# Patient Record
Sex: Male | Born: 1985 | Race: White | Hispanic: No | Marital: Married | Smoking: Never smoker
Health system: Southern US, Community
[De-identification: ages and names within clinical notes are randomized; demographics above are authoritative.]

---
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
## 2014-11-04 HISTORY — PX: MOUTH SURGERY: SHX715

## 2014-12-13 ENCOUNTER — Ambulatory Visit (INDEPENDENT_AMBULATORY_CARE_PROVIDER_SITE_OTHER): Payer: Managed Care, Other (non HMO) | Admitting: Family Medicine

## 2014-12-13 ENCOUNTER — Encounter: Payer: Self-pay | Admitting: Family Medicine

## 2014-12-13 VITALS — BP 140/81 | HR 63 | Ht 75.0 in | Wt 263.0 lb

## 2014-12-13 DIAGNOSIS — Z0000 Encounter for general adult medical examination without abnormal findings: Secondary | ICD-10-CM | POA: Diagnosis not present

## 2014-12-13 NOTE — Progress Notes (Signed)
CC: Arthur Payne is a 29 y.o. male is here for Establish Care and Annual Exam   Subjective: HPI:  No family history of colon cancer or prostate cancer, will consider screening at age 36  Influenza Vaccine: Declined Pneumovax: No current indication Td/Tdap: Up-to-date Zoster: (Start 30 yo)  Presented for complete physical exam without any complaints.  Review of Systems - General ROS: negative for - chills, fever, night sweats, weight gain or weight loss Ophthalmic ROS: negative for - decreased vision Psychological ROS: negative for - anxiety or depression ENT ROS: negative for - hearing change, nasal congestion, tinnitus or allergies Hematological and Lymphatic ROS: negative for - bleeding problems, bruising or swollen lymph nodes Breast ROS: negative Respiratory ROS: no cough, shortness of breath, or wheezing Cardiovascular ROS: no chest pain or dyspnea on exertion Gastrointestinal ROS: no abdominal pain, change in bowel habits, or black or bloody stools Genito-Urinary ROS: negative for - genital discharge, genital ulcers, incontinence or abnormal bleeding from genitals Musculoskeletal ROS: negative for - joint pain or muscle pain Neurological ROS: negative for - headaches or memory loss Dermatological ROS: negative for lumps, mole changes, rash and skin lesion changes History reviewed. No pertinent past medical history.  Past Surgical History  Procedure Laterality Date  . Appendectomy  1992  . Mouth surgery  11/2014   History reviewed. No pertinent family history.  Social History   Social History  . Marital Status: Married    Spouse Name: N/A  . Number of Children: N/A  . Years of Education: N/A   Occupational History  . Not on file.   Social History Main Topics  . Smoking status: Never Smoker   . Smokeless tobacco: Not on file  . Alcohol Use: 6.0 oz/week    10 Standard drinks or equivalent per week  . Drug Use: No  . Sexual Activity:    Partners: Female    Other Topics Concern  . Not on file   Social History Narrative  . No narrative on file     Objective: BP 140/81 mmHg  Pulse 63  Ht  (1.905 m)  Wt 263 lb (119.296 kg)  BMI 32.87 kg/m2  General: No Acute Distress HEENT: Atraumatic, normocephalic, conjunctivae normal without scleral icterus.  No nasal discharge, hearing grossly intact, TMs with good landmarks bilaterally with no middle ear abnormalities, posterior pharynx clear without oral lesions. Neck: Supple, trachea midline, no cervical nor supraclavicular adenopathy. Pulmonary: Clear to auscultation bilaterally without wheezing, rhonchi, nor rales. Cardiac: Regular rate and rhythm.  No murmurs, rubs, nor gallops. No peripheral edema.  2+ peripheral pulses bilaterally. Abdomen: Bowel sounds normal.  No masses.  Non-tender without rebound.  Negative Murphy's sign. MSK: Grossly intact, no signs of weakness.  Full strength throughout upper and lower extremities.  Full ROM in upper and lower extremities.  No midline spinal tenderness. Neuro: Gait unremarkable, CN II-XII grossly intact.  C5-C6 Reflex 2/4 Bilaterally, L4 Reflex 2/4 Bilaterally.  Cerebellar function intact. Skin: No rashes. Psych: Alert and oriented to person/place/time.  Thought process normal. No anxiety/depression.  Assessment & Plan: Arthur Payne was seen today for establish care and annual exam.  Diagnoses and all orders for this visit:  Annual physical exam -     Lipid panel -     CBC -     COMPLETE METABOLIC PANEL WITH GFR   Healthy lifestyle interventions including but not limited to regular exercise, a healthy low fat diet, moderation of salt intake, the dangers of tobacco/alcohol/recreational drug use,  nutrition supplementation, and accident avoidance were discussed with the patient and a handout was provided for future reference.  Return in about 1 year (around 12/13/2015).

## 2014-12-20 ENCOUNTER — Telehealth: Payer: Self-pay | Admitting: *Deleted

## 2014-12-20 NOTE — Telephone Encounter (Signed)
Pt needs two notarized copies of a letter stating he is in good health for ka different adoption agency and for the gov't two submit to adopt a child

## 2014-12-27 ENCOUNTER — Encounter: Payer: Self-pay | Admitting: Family Medicine

## 2017-06-30 ENCOUNTER — Other Ambulatory Visit: Payer: Self-pay

## 2017-06-30 ENCOUNTER — Encounter: Payer: Self-pay | Admitting: Emergency Medicine

## 2017-06-30 ENCOUNTER — Emergency Department (INDEPENDENT_AMBULATORY_CARE_PROVIDER_SITE_OTHER)
Admission: EM | Admit: 2017-06-30 | Discharge: 2017-06-30 | Disposition: A | Discharge status: Home / Self Care | Payer: Managed Care, Other (non HMO) | Source: Home / Self Care

## 2017-06-30 DIAGNOSIS — L237 Allergic contact dermatitis due to plants, except food: Secondary | ICD-10-CM

## 2017-06-30 MED ORDER — PREDNISONE 10 MG PO TABS: ORAL_TABLET | ORAL | 0 refills | Status: DC

## 2017-06-30 MED ORDER — TRIAMCINOLONE ACETONIDE 0.1 % EX CREA: 1.0000 "application " | TOPICAL_CREAM | Freq: Two times a day (BID) | CUTANEOUS | 1 refills | Status: DC

## 2017-06-30 NOTE — Discharge Instructions (Addendum)
Wash well as directed with lots of soap and water and gentle scrubbing.  Wash clothing  Apply triamcinolone cream twice daily to areas of rash  Take prednisone 20 mg 3 pills daily for 3 days, then 2 daily for 3 days, then 1 daily for 3 days, then one half daily.  Best taken after breakfast.  Check your yard closely to try to avoid future re-exposure.  Return as needed

## 2017-06-30 NOTE — ED Provider Notes (Signed)
[name]    CSN: [id number] Arrival date & time: 06/30/17  0816     History   Chief Complaint Chief Complaint  Patient presents with  . Rash    HPI Arthur Payne is a 32 y.o. male.   HPI Patient was working in his yard over the weekend and got poison ivy.  He has had it before, and one time it took about 2 months to go away fully.  He is primarily on his right arm, summaries chest wall, some on his thighs and ankles.  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 History reviewed. No pertinent past medical history.   Not diabetic.  There are no active problems to display for this patient.   Past Surgical History:  Procedure Laterality Date  . APPENDECTOMY  1992  . MOUTH SURGERY  11/2014       Home Medications    Prior to Admission medications   Not on File    Family History History reviewed. No pertinent family history.  Social History Social History   Tobacco Use  . Smoking status: Never Smoker  . Smokeless tobacco: Never Used  Substance Use Topics  . Alcohol use: Yes    Alcohol/week: 6.0 oz    Types: 10 Standard drinks or equivalent per week  . Drug use: No     Allergies   Patient has no known allergies.   Review of Systems Review of Systems No other major complaints.  Physical Exam Triage Vital Signs ED Triage Vitals  Enc Vitals Group     BP 06/30/17 0841 119/78     Pulse Rate 06/30/17 0841 84     Resp 06/30/17 0841 16     Temp 06/30/17 0841 98.4 F (36.9 C)     Temp Source 06/30/17 0841 Oral     SpO2 06/30/17 0841 98 %     Weight 06/30/17 0843 270 lb (122.5 kg)     Height 06/30/17 0843 6\' 3"  (1.905 m)     Head Circumference --      Peak Flow --      Pain Score 06/30/17 0843 3     Pain Loc --      Pain Edu? --      Excl. in GC? --    No data found.  Updated Vital Signs BP 119/78 (BP Location: Left Arm)   Pulse 84   Temp 98.4 F (36.9 C) (Oral)   Resp 16   Ht 6\' 3"  (1.905 m)   Wt 270 lb (122.5  kg)   SpO2 98%   BMI 33.75 kg/m   Visual Acuity Right Eye Distance:   Left Eye Distance:   Bilateral Distance:    Right Eye Near:   Left Eye Near:    Bilateral Near:     Physical Exam Contact dermatitis on forearm with linear excoriation.  Some in other areas as noted.  Do not have him undress entirely.  UC Treatments / Results  Labs (all labs ordered are listed, but only abnormal results are displayed) Labs Reviewed - No data to display  EKG None Radiology No results found.  Procedures Procedures (including critical care time) No procedures Medications Ordered in UC Medications - No data to display   Initial Impression / Assessment and Plan / UC Course  I have reviewed the triage vital signs and the nursing notes.  Pertinent labs & imaging results that were available during my care of the patient were reviewed  by me and considered in my medical decision making (see chart for details).     Contact dermatitis consistent with poison ivy.  Long discussion explaining poison ivy, prevention and management.  Final Clinical Impressions(s) / UC Diagnoses   Final diagnoses:  None    ED Discharge Orders    None     Wash well as directed with lots of soap and water and gentle scrubbing.  Wash clothing  Apply triamcinolone cream twice daily to areas of rash  Take prednisone 20 mg 3 pills daily for 3 days, then 2 daily for 3 days, then 1 daily for 3 days, then one half daily.  Best taken after breakfast.  Check your yard closely to try to avoid future re-exposure.  Return as needed  Controlled Substance Prescriptions [place] Controlled Substance Registry consulted? No   Peyton NajjarHopper, Login Muckleroy H, MD 06/30/17 1400

## 2017-06-30 NOTE — ED Triage Notes (Signed)
Patient did yardwork 4 days ago; 3 days ago noticed rash on right arm and both legs which has worsened; he believes it is poison ivy.

## 2018-09-11 ENCOUNTER — Telehealth: Payer: Self-pay

## 2018-09-11 NOTE — Telephone Encounter (Signed)
Arthur Payne called asking for a quantiferon gold test for his work. I advised him Dr Ileene Rubens is no longer with our office. He states he transferred to Dr Georgina Snell along with his family. Please advise.

## 2018-09-12 NOTE — Telephone Encounter (Signed)
Appointment has been schedled(pt apologized for disconnect).

## 2018-09-12 NOTE — Telephone Encounter (Signed)
Patient was last seen in this clinic in 2016 by Dr. Ileene Rubens.  Please advise patient to schedule a well adult visit with me in the near future.  We will catch up on his basic health as well as order gold QuantiFERON test.  Advised patient to bring forms

## 2018-09-12 NOTE — Telephone Encounter (Signed)
Called patient and the call was disconnected. Can we get him in for a well adult visit with Dr Georgina Snell.

## 2018-09-15 ENCOUNTER — Ambulatory Visit (INDEPENDENT_AMBULATORY_CARE_PROVIDER_SITE_OTHER): Payer: Managed Care, Other (non HMO) | Admitting: Family Medicine

## 2018-09-15 ENCOUNTER — Encounter: Payer: Self-pay | Admitting: Family Medicine

## 2018-09-15 VITALS — BP 117/79 | HR 75 | Ht 74.5 in | Wt 269.0 lb

## 2018-09-15 DIAGNOSIS — Z6834 Body mass index (BMI) 34.0-34.9, adult: Secondary | ICD-10-CM

## 2018-09-15 DIAGNOSIS — Z0000 Encounter for general adult medical examination without abnormal findings: Secondary | ICD-10-CM

## 2018-09-15 DIAGNOSIS — Z111 Encounter for screening for respiratory tuberculosis: Secondary | ICD-10-CM | POA: Diagnosis not present

## 2018-09-15 HISTORY — DX: Body mass index (BMI) 34.0-34.9, adult: Z68.34

## 2018-09-15 LAB — VARICELLA ZOSTER ANTIBODY, IGG: Varicella zoster IgG: 811

## 2018-09-15 LAB — MEASLES/MUMPS/RUBELLA IMMUNITY
MUMPS AB IGG, IFA (CSF): 82.8
RUBELLA AB (IGG) (REFL): 2.18
RUBEOLA AB, IGG: >300

## 2018-09-15 NOTE — Progress Notes (Signed)
       Arthur Payne is a 33 y.o. male who presents to [facility] today for well adult visit.   He feels well.  He does not any medical problems that he is aware of.  He notes that he has gained some weight and has been working to lose it.  Is Arthur Payne lost about 20 pounds by eating a healthier diet and eliminated sugary beverages.  He gets exercise at work but does not have outside of work exercise opportunities.  He needs a TB Gold QuantiFERON test for work.  He is not fasting this morning.   Working diet. Eating more fruits and less sweets.   ROS as above:  Past Medical History:  Diagnosis Date  . BMI 34.0-34.9,adult 09/15/2018   Past Surgical History:  Procedure Laterality Date  . APPENDECTOMY  1992  . MOUTH SURGERY  11/2014   Social History   Tobacco Use  . Smoking status: Never Smoker  . Smokeless tobacco: Never Used  Substance Use Topics  . Alcohol use: Yes    Alcohol/week: 10.0 standard drinks    Types: 10 Standard drinks or equivalent per week   family history is not on file.  Medications: No current outpatient medications on file.   No current facility-administered medications for this visit.    No Known Allergies  Health Maintenance Health Maintenance  Topic Date Due  . HIV Screening  07/28/1999  . INFLUENZA VACCINE  11/04/2018  . TETANUS/TDAP  09/30/2026     Exam:  BP 117/79   Pulse 75   Ht 6' 2.5" (1.892 m)   Wt 269 lb (122 kg)   SpO2 99%   BMI 34.08 kg/m  Wt Readings from Last 5 Encounters:  09/15/18 269 lb (122 kg)  06/30/17 270 lb (122.5 kg)  12/13/14 263 lb (119.3 kg)      Gen: Well NAD HEENT: EOMI,  MMM Lungs: Normal work of breathing. CTABL Heart: RRR no MRG Abd: NABS, Soft. Nondistended, Nontender Exts: Brisk capillary refill, warm and well perfused.  Psych: Alert and oriented normal speech thought process and affect.  Depression screen PHQ 2/9 09/15/2018  Decreased Interest 0  Down, Depressed, Hopeless 0  PHQ - 2 Score 0       Lab and Radiology Results No results found for this or any previous visit (from the past 72 hour(s)). No results found.    Assessment and Plan: 33 y.o. male with well adult.  Doing reasonably well.  Will screen for TB.  We will also check CBC metabolic panel.  Would like to check lipid panel but patient is not fasting.  We will try to get that at the next opportunity.  Discussed lifestyle management strategies for obesity.  Recommend exercise and reduce calorie diet.  Patient declined HIV screening.  Vaccines up-to-date.  PDMP not reviewed this encounter. Orders Placed This Encounter  Procedures  . Measles/Mumps/Rubella Immunity    This order was created through External Result Entry  . Varicella zoster antibody, IgG    This order was created through External Result Entry  . CBC  . COMPLETE METABOLIC PANEL WITH GFR  . QuantiFERON-TB Gold Plus   No orders of the defined types were placed in this encounter.    Discussed warning signs or symptoms. Please see discharge instructions. Patient expresses understanding.

## 2018-09-15 NOTE — Patient Instructions (Signed)
Thank you for coming in today.  GEt labs now.  I will get the results back to you ASAP>  Sign up for mychart. Recheck yearly.  Work on [employer]. Diet goal <2000 calories per day.   Keep me updated.

## 2018-09-17 LAB — CBC
HCT: 49.1 % (ref 38.5–50.0)
Hemoglobin: 16.5 g/dL (ref 13.2–17.1)
MCH: 28.7 pg (ref 27.0–33.0)
MCHC: 33.6 g/dL (ref 32.0–36.0)
MCV: 85.5 fL (ref 80.0–100.0)
MPV: 10 fL (ref 7.5–12.5)
Platelets: 235 10*3/uL (ref 140–400)
RBC: 5.74 10*6/uL (ref 4.20–5.80)
RDW: 13.3 % (ref 11.0–15.0)
WBC: 8.3 10*3/uL (ref 3.8–10.8)

## 2018-09-17 LAB — COMPLETE METABOLIC PANEL WITH GFR
AG Ratio: 1.7 (calc) (ref 1.0–2.5)
ALT: 31 U/L (ref 9–46)
AST: 20 U/L (ref 10–40)
Albumin: 4.8 g/dL (ref 3.6–5.1)
Alkaline phosphatase (APISO): 65 U/L (ref 36–130)
BUN: 14 mg/dL (ref 7–25)
CO2: 21 mmol/L (ref 20–32)
Calcium: 10 mg/dL (ref 8.6–10.3)
Chloride: 104 mmol/L (ref 98–110)
Creat: 0.98 mg/dL (ref 0.60–1.35)
GFR, Est African American: 116 mL/min/{1.73_m2} (ref >=60)
GFR, Est Non African American: 100 mL/min/{1.73_m2} (ref >=60)
Globulin: 2.8 g/dL (calc) (ref 1.9–3.7)
Glucose, Bld: 99 mg/dL (ref 65–139)
Potassium: 4.7 mmol/L (ref 3.5–5.3)
Sodium: 139 mmol/L (ref 135–146)
Total Bilirubin: 1 mg/dL (ref 0.2–1.2)
Total Protein: 7.6 g/dL (ref 6.1–8.1)

## 2018-09-17 LAB — QUANTIFERON-TB GOLD PLUS
Mitogen-NIL: 6.79 IU/mL
NIL: 0.05 IU/mL
QuantiFERON-TB Gold Plus: NEGATIVE
TB1-NIL: 0 IU/mL
TB2-NIL: <0 IU/mL

## 2020-04-23 ENCOUNTER — Encounter: Payer: Self-pay | Admitting: Medical-Surgical

## 2020-04-23 ENCOUNTER — Other Ambulatory Visit: Payer: Self-pay

## 2020-04-23 ENCOUNTER — Ambulatory Visit (INDEPENDENT_AMBULATORY_CARE_PROVIDER_SITE_OTHER): Payer: Managed Care, Other (non HMO) | Admitting: Medical-Surgical

## 2020-04-23 ENCOUNTER — Ambulatory Visit (INDEPENDENT_AMBULATORY_CARE_PROVIDER_SITE_OTHER): Payer: Managed Care, Other (non HMO)

## 2020-04-23 VITALS — BP 117/80 | HR 96 | Temp 98.5°F | Ht 73.0 in | Wt 276.7 lb

## 2020-04-23 DIAGNOSIS — W009XXA Unspecified fall due to ice and snow, initial encounter: Secondary | ICD-10-CM | POA: Diagnosis not present

## 2020-04-23 DIAGNOSIS — Z7689 Persons encountering health services in other specified circumstances: Secondary | ICD-10-CM

## 2020-04-23 DIAGNOSIS — S0990XA Unspecified injury of head, initial encounter: Secondary | ICD-10-CM

## 2020-04-23 MED ORDER — MELOXICAM 15 MG PO TABS: 15.0000 mg | ORAL_TABLET | Freq: Every day | ORAL | 0 refills | Status: DC

## 2020-04-23 MED ORDER — TRAMADOL HCL 50 MG PO TABS: 50.0000 mg | ORAL_TABLET | Freq: Three times a day (TID) | ORAL | 0 refills | Status: AC | PRN

## 2020-04-23 MED ORDER — CYCLOBENZAPRINE HCL 10 MG PO TABS: 10.0000 mg | ORAL_TABLET | Freq: Three times a day (TID) | ORAL | 0 refills | Status: DC | PRN

## 2020-04-23 NOTE — Patient Instructions (Signed)
Concussion, Adult  A concussion is a brain injury from a hard, direct hit (trauma) to your head or body. This direct hit causes your brain to quickly shake back and forth inside your skull. A concussion may also be called a mild traumatic brain injury (TBI). Healing from this injury can take time. What are the causes? This condition is caused by:  A direct hit to your head, such as: ? Running into a player during a game. ? Being hit in a fight. ? Hitting your head on a hard surface.  A quick and sudden movement of the head or neck, such as in a car crash. What are the signs or symptoms? The signs of a concussion can be hard to notice. They may be missed by you, family members, and doctors. You may look fine on the outside but may not act or feel normal. Physical symptoms  Headaches.  Being dizzy.  Problems with body balance.  Being sensitive to light or noise.  Vomiting or feeling like you may vomit.  Being tired.  Problems seeing or hearing.  Not sleeping or eating as you used to.  Seizure. Mental and emotional symptoms  Feeling grouchy (irritable).  Having mood changes.  Problems remembering things.  Trouble focusing your mind (concentrating), organizing, or making decisions.  Being slow to think, act, react, speak, or read.  Feeling worried or nervous (anxious).  Feeling sad (depressed). How is this treated? This condition may be treated by:  Stopping sports or activity if you are injured. If you hit your head or have signs of concussion: ? Do not return to sports or activities the same day. ? Get checked by a doctor before you return to your activities.  Resting your body and your mind.  Being watched carefully, often at home.  Medicines to help with symptoms such as: ? Headaches. ? Feeling like you may vomit. ? Problems with sleep.  Avoiding alcohol and drugs.  Being asked to go to a concussion clinic or a place to help you recover  (rehabilitation center). Recovery from a concussion can take time. Return to activities only:  When you are fully healed.  When your doctor says it is safe. Avoid taking strong pain medicines (opioids) for a concussion. Follow these instructions at home: Activity  Limit activities that need a lot of thought or focus, such as: ? Homework or work for your job. ? Watching TV. ? Using the computer or phone. ? Playing memory games and puzzles.  Rest. Rest helps your brain heal. Make sure you: ? Get plenty of sleep. Most adults should get 7-9 hours of sleep each night. ? Rest during the day. Take naps or breaks when you feel tired.  Avoid activity like exercise until your doctor says its safe. Stop any activity that makes symptoms worse.  Do not do activities that could cause a second concussion, such as riding a bike or playing sports.  Ask your doctor when you can return to your normal activities, such as school, work, sports, and driving. Your ability to react may be slower. Do not do these activities if you are dizzy. General instructions  Take over-the-counter and prescription medicines only as told by your doctor.  Do not drink alcohol until your doctor says you can.  Watch your symptoms and tell other people to do the same. Other problems can occur after a concussion. Older adults have a higher risk of serious problems.  Tell your work manager, teachers, school nurse, school   counselor, coach, or athletic trainer about your injury and symptoms. Tell them about what you can or cannot do.  Keep all follow-up visits as told by your doctor. This is important.   How is this prevented? It is very important that you do not get another brain injury. In rare cases, another injury can cause brain damage that will not go away, brain swelling, or death. The risk of this is greatest in the first 7-10 days after a head injury. To avoid injuries:  Stop activities that could lead to a second  concussion, such as contact sports, until your doctor says it is okay.  When you return to sports or activities: ? Do not crash into other players. This is how most concussions happen. ? Follow the rules. ? Respect other players. Do not engage in violent behavior while playing.  Get regular exercise. Do strength and balance training.  Wear a helmet that fits you well during sports, biking, or other activities.  Helmets can help protect you from serious skull and brain injuries, but they do not protect you from a concussion. Even when wearing a helmet, you should avoid being hit in the head. Contact a doctor if:  Your symptoms do not get better.  You have new symptoms.  You have another injury. Get help right away if:  You have bad headaches or your headaches get worse.  You feel weak or numb in any part of your body.  You feel mixed up (confused).  Your balance gets worse.  You vomit often.  You feel more sleepy than normal.  You cannot speak well, or have slurred speech.  You have a seizure.  Others have trouble waking you up.  You have changes in how you act.  You have changes in how you see (vision).  You pass out (lose consciousness). These symptoms may be an emergency. Do not wait to see if the symptoms will go away. Get medical help right away. Call your local emergency services (911 in the [place] Do not drive yourself to the hospital. Summary  A concussion is a brain injury from a hard, direct hit (trauma) to your head or body.  This condition is treated with rest and careful watching of symptoms.  Ask your doctor when you can return to your normal activities, such as school, work, or driving.  Get help right away if you have a very bad headache, feel weak in any part of your body, have a seizure, have changes in how you act or see, or if you are mixed up or more sleepy than normal. This information is not intended to replace advice given to you by your  health care provider. Make sure you discuss any questions you have with your health care provider. Document Revised: 02/01/2019 Document Reviewed: 02/01/2019 Elsevier Patient Education  2021 Elsevier Inc.  

## 2020-04-23 NOTE — Progress Notes (Signed)
Subjective:    CC: Fall with head trauma  HPI: Pleasant 35 year old male accompanied by his wife presenting today for evaluation after he had a fall on ice yesterday, hitting his head on the posterior area. Had some anterograde and retrograde amnesia. He reports losing consciousness for 60-90 minutes but his wife was present and notes that he did not. He does  Not remember the fall or his activities post fall. Had a bad posterior headached yesterday with some lingering headache today. No nausea or vomiting. Decreased appetite but able to eat. Numbness/tingling/stinging pain in all extremities at the time of fall but none since then. Weakness in bilateral grip with pain in bilateral wrists. No balance issues. Some difficulty finding words and some slower processing mentally.   Wife reports hearing him scream and found him laying beside the car. He told her he hit his head and got mad at her when she started asking him orientation questions. He was verbal and no evidence of pupil dilation or unequal sizes. She went back inside thinking he was ok. At some point he got up and started the car. He was talking loudly on the phone. After a bit he came in and sat down saying he was dizzy. A neighbor came over and put his hand on his leg. Patient said he couldn't feel the hand on his leg and got upset. At that point, they called EMS and he was evaluated on site but not taken to the hospital.   I reviewed the past medical history, family history, social history, surgical history, and allergies today and no changes were needed.  Please see the problem list section below in epic for further details.  Past Medical History: Past Medical History:  Diagnosis Date  . BMI 34.0-34.9,adult 09/15/2018   Past Surgical History: Past Surgical History:  Procedure Laterality Date  . APPENDECTOMY  1992  . MOUTH SURGERY  11/2014   Social History: Social History   Socioeconomic History  . Marital status: Married     Spouse name: Not on file  . Number of children: Not on file  . Years of education: Not on file  . Highest education level: Not on file  Occupational History  . Not on file  Tobacco Use  . Smoking status: Never Smoker  . Smokeless tobacco: Never Used  Substance and Sexual Activity  . Alcohol use: Yes    Alcohol/week: 10.0 standard drinks    Types: 10 Standard drinks or equivalent per week  . Drug use: No  . Sexual activity: Yes    Partners: Female  Other Topics Concern  . Not on file  Social History Narrative  . Not on file   Social Determinants of Health   Financial Resource Strain: Not on file  Food Insecurity: Not on file  Transportation Needs: Not on file  Physical Activity: Not on file  Stress: Not on file  Social Connections: Not on file   Family History: History reviewed. No pertinent family history. Allergies: No Known Allergies Medications: See med rec.  Review of Systems: See HPI for pertinent positives and negatives.   Objective:    General: Well Developed, well nourished, and in no acute distress.  Neuro: Alert and oriented x3, extra-ocular muscles intact, sensation grossly intact. Bilateral hand grips weak, equal. Speech clear but slower than normal. Speaking in full sentences. Rapid movements intact. Rhomberg negative.  HEENT: Normocephalic, swollen lump on the posterior aspect of head that is tender to touch, pupils equal round reactive to  light, neck supple, no masses, no lymphadenopathy, thyroid nonpalpable.  Skin: Warm and dry. Cardiac: Regular rate and rhythm, no murmurs rubs or gallops, no lower extremity edema.  Respiratory: Clear to auscultation bilaterally. Not using accessory muscles.  Impression and Recommendations:    1. Head trauma, initial encounter/Fall on ice Stat CT head w/o contrast. Tylenol as needed for pain. Advise rest as needed. Warning signs/symptoms to seek ER care for reviewed with patient and wife.  - CT Head Wo Contrast;  Future  Return in about 2 weeks (around 05/07/2020) for head trauma follow up.  ___________________________________________ Thayer Ohm, DNP, APRN, FNP-BC Primary Care and Sports Medicine [facility] [place]

## 2020-05-07 ENCOUNTER — Encounter: Payer: Self-pay | Admitting: Medical-Surgical

## 2020-05-07 ENCOUNTER — Other Ambulatory Visit: Payer: Self-pay

## 2020-05-07 ENCOUNTER — Ambulatory Visit (INDEPENDENT_AMBULATORY_CARE_PROVIDER_SITE_OTHER): Payer: Managed Care, Other (non HMO) | Admitting: Medical-Surgical

## 2020-05-07 VITALS — BP 131/81 | HR 82 | Temp 98.3°F | Ht 73.0 in | Wt 278.0 lb

## 2020-05-07 DIAGNOSIS — S0990XA Unspecified injury of head, initial encounter: Secondary | ICD-10-CM | POA: Diagnosis not present

## 2020-05-07 DIAGNOSIS — W009XXA Unspecified fall due to ice and snow, initial encounter: Secondary | ICD-10-CM | POA: Diagnosis not present

## 2020-05-07 NOTE — Progress Notes (Signed)
  Subjective:    CC: fall follow up  HPI: Pleasant 35 year old male presenting for follow up after his fall with head trauma. He reports it took several days for his soreness to abate and the neck pain was the last to resolve. He has had some difficulty with word finding intermittently but reports he is able to find the correct words after a few minutes. No other thought process changes. Denies headaches, vision changes, weakness, or difficulty walking/balancing.   I reviewed the past medical history, family history, social history, surgical history, and allergies today and no changes were needed.  Please see the problem list section below in epic for further details.  Past Medical History: Past Medical History:  Diagnosis Date  . BMI 34.0-34.9,adult 09/15/2018   Past Surgical History: Past Surgical History:  Procedure Laterality Date  . APPENDECTOMY  1992  . MOUTH SURGERY  11/2014   Social History: Social History   Socioeconomic History  . Marital status: Married    Spouse name: Not on file  . Number of children: Not on file  . Years of education: Not on file  . Highest education level: Not on file  Occupational History  . Not on file  Tobacco Use  . Smoking status: Never Smoker  . Smokeless tobacco: Never Used  Substance and Sexual Activity  . Alcohol use: Yes    Alcohol/week: 10.0 standard drinks    Types: 10 Standard drinks or equivalent per week  . Drug use: No  . Sexual activity: Yes    Partners: Female  Other Topics Concern  . Not on file  Social History Narrative  . Not on file   Social Determinants of Health   Financial Resource Strain: Not on file  Food Insecurity: Not on file  Transportation Needs: Not on file  Physical Activity: Not on file  Stress: Not on file  Social Connections: Not on file   Family History: History reviewed. No pertinent family history. Allergies: No Known Allergies Medications: See med rec.  Review of Systems: See HPI for  pertinent positives and negatives.   Objective:    General: Well Developed, well nourished, and in no acute distress.  Neuro: Alert and oriented x3.  HEENT: Normocephalic, atraumatic.  Skin: Warm and dry, no rashes. Cardiac: Regular rate and rhythm.  Respiratory:  Not using accessory muscles, speaking in full sentences.  Impression and Recommendations:    1. Head trauma after fall from slipping on snow/ice Doing well after fall. Some word finding trouble but overall, back to baseline. Cleared for return to work.   Return if symptoms worsen or fail to improve. ___________________________________________ Thayer Ohm, DNP, APRN, FNP-BC Primary Care and Sports Medicine [facility] [place]

## 2020-09-17 ENCOUNTER — Telehealth: Payer: Self-pay | Admitting: *Deleted

## 2020-09-17 NOTE — Telephone Encounter (Signed)
Pt's wife called and stated that he is at a job site and there is an outbreak of scabies and bedbugs. She would like to know what he needs to do so that he doesn't bring this home.

## 2020-09-17 NOTE — Telephone Encounter (Signed)
Pt's wife advised of recommendations and will inform him of these

## 2020-10-31 ENCOUNTER — Encounter: Payer: Self-pay | Admitting: Medical-Surgical

## 2020-12-20 ENCOUNTER — Emergency Department (INDEPENDENT_AMBULATORY_CARE_PROVIDER_SITE_OTHER)
Admission: RE | Admit: 2020-12-20 | Discharge: 2020-12-20 | Disposition: A | Discharge status: Home / Self Care | Payer: Managed Care, Other (non HMO) | Source: Ambulatory Visit | Attending: Family Medicine | Admitting: Family Medicine

## 2020-12-20 ENCOUNTER — Other Ambulatory Visit: Payer: Self-pay

## 2020-12-20 VITALS — BP 152/84 | HR 74 | Temp 98.5°F | Resp 18 | Ht 74.0 in | Wt 265.0 lb

## 2020-12-20 DIAGNOSIS — B309 Viral conjunctivitis, unspecified: Secondary | ICD-10-CM

## 2020-12-20 MED ORDER — ERYTHROMYCIN 5 MG/GM OP OINT: 1.0000 "application " | TOPICAL_OINTMENT | Freq: Three times a day (TID) | OPHTHALMIC | 0 refills | Status: DC

## 2020-12-20 NOTE — ED Provider Notes (Signed)
[name]    CSN: [id number] Arrival date & time: 12/20/20  1059      History   Chief Complaint Chief Complaint  Patient presents with   Appointment    Possible Pink Eye    HPI Arthur Payne is a 35 y.o. male.   HPI Patient has some redness of his right eye when he woke up this morning.  It is irritated.  He is tearing.  It is slightly light sensitive.  No purulence.  No trauma to the eye.  States vision is normal  Past Medical History:  Diagnosis Date   BMI 34.0-34.9,adult 09/15/2018    Patient Active Problem List   Diagnosis Date Noted   BMI 34.0-34.9,adult 09/15/2018    Past Surgical History:  Procedure Laterality Date   APPENDECTOMY  1992   MOUTH SURGERY  11/2014       Home Medications    Prior to Admission medications   Medication Sig Start Date End Date Taking? Authorizing Provider  erythromycin ophthalmic ointment Place 1 application into the right eye 3 (three) times daily. Place a 1/4 inch ribbon of ointment into the lower eyelid. 12/20/20  Yes Eustace Moore, MD  acetaminophen (TYLENOL) 500 MG tablet Take 500 mg by mouth every 6 (six) hours as needed.    [provider]    Family History No family history on file.  Social History Social History   Tobacco Use   Smoking status: Never   Smokeless tobacco: Never  Substance Use Topics   Alcohol use: Yes    Alcohol/week: 10.0 standard drinks    Types: 10 Standard drinks or equivalent per week   Drug use: No     Allergies   Patient has no known allergies.   Review of Systems Review of Systems See HPI  Physical Exam Triage Vital Signs ED Triage Vitals  Enc Vitals Group     BP 12/20/20 1110 (!) 152/84     Pulse Rate 12/20/20 1110 74     Resp 12/20/20 1110 18     Temp 12/20/20 1110 98.5 F (36.9 C)     Temp Source 12/20/20 1110 Oral     SpO2 12/20/20 1110 97 %     Weight 12/20/20 1111 265 lb (120.2 kg)     Height 12/20/20 1111 6\' 2"  (1.88 m)     Head  Circumference --      Peak Flow --      Pain Score 12/20/20 1111 2     Pain Loc --      Pain Edu? --      Excl. in GC? --    No data found.  Updated Vital Signs BP (!) 152/84 (BP Location: Right Arm)   Pulse 74   Temp 98.5 F (36.9 C) (Oral)   Resp 18   Ht 6\' 2"  (1.88 m)   Wt 120.2 kg   SpO2 97%   BMI 34.02 kg/m      Physical Exam Constitutional:      General: He is not in acute distress.    Appearance: Normal appearance. He is well-developed.  HENT:     Head: Normocephalic and atraumatic.     Right Ear: Tympanic membrane, ear canal and external ear normal.     Left Ear: Tympanic membrane, ear canal and external ear normal.     Nose: Nose normal.     Mouth/Throat:     Mouth: Mucous membranes are moist.     Pharynx: No posterior  oropharyngeal erythema.  Eyes:     General: Lids are normal. Lids are everted, no foreign bodies appreciated. Vision grossly intact. Gaze aligned appropriately.     Conjunctiva/sclera:     Right eye: Right conjunctiva is injected. No chemosis, exudate or hemorrhage.    Pupils: Pupils are equal, round, and reactive to light.  Cardiovascular:     Rate and Rhythm: Normal rate.  Pulmonary:     Effort: Pulmonary effort is normal. No respiratory distress.  Abdominal:     General: There is no distension.     Palpations: Abdomen is soft.  Musculoskeletal:        General: Normal range of motion.     Cervical back: Normal range of motion.  Skin:    General: Skin is warm and dry.  Neurological:     Mental Status: He is alert.     UC Treatments / Results  Labs (all labs ordered are listed, but only abnormal results are displayed) Labs Reviewed - No data to display  EKG   Radiology No results found.  Procedures Procedures (including critical care time)  Medications Ordered in UC Medications - No data to display  Initial Impression / Assessment and Plan / UC Course  I have reviewed the triage vital signs and the nursing  notes.  Pertinent labs & imaging results that were available during my care of the patient were reviewed by me and considered in my medical decision making (see chart for details).     Discussed viral conjunctivitis.  Contagiousness.  Treatment with antibiotics even though it is a virus, ointment is soothing.  May also use liquid tears as needed. Final Clinical Impressions(s) / UC Diagnoses   Final diagnoses:  Acute viral conjunctivitis of right eye     Discharge Instructions      Use antibiotic ointment 3 times a day until symptoms improve Use careful handwashing and try to prevent spread of infection Return as needed   ED Prescriptions     Medication Sig Dispense Auth. Provider   erythromycin ophthalmic ointment Place 1 application into the right eye 3 (three) times daily. Place a 1/4 inch ribbon of ointment into the lower eyelid. 1 g Eustace Moore, MD      PDMP not reviewed this encounter.   Eustace Moore, MD 12/20/20 [phone]

## 2020-12-20 NOTE — Discharge Instructions (Signed)
Use antibiotic ointment 3 times a day until symptoms improve Use careful handwashing and try to prevent spread of infection Return as needed

## 2020-12-20 NOTE — ED Triage Notes (Signed)
Patient c/o possible pink eye in right eye.  Noticed last night eye was red and watery.  No discharge.  Patient does wear glasses.  No blurred vision.

## 2021-08-06 DIAGNOSIS — H1033 Unspecified acute conjunctivitis, bilateral: Secondary | ICD-10-CM | POA: Diagnosis not present

## 2021-08-27 DIAGNOSIS — I471 Supraventricular tachycardia: Secondary | ICD-10-CM | POA: Diagnosis not present

## 2021-08-27 DIAGNOSIS — Z01810 Encounter for preprocedural cardiovascular examination: Secondary | ICD-10-CM | POA: Diagnosis not present

## 2021-08-27 DIAGNOSIS — Z01812 Encounter for preprocedural laboratory examination: Secondary | ICD-10-CM | POA: Diagnosis not present

## 2021-08-28 DIAGNOSIS — Z791 Long term (current) use of non-steroidal anti-inflammatories (NSAID): Secondary | ICD-10-CM | POA: Diagnosis not present

## 2021-08-28 DIAGNOSIS — Z79899 Other long term (current) drug therapy: Secondary | ICD-10-CM | POA: Diagnosis not present

## 2021-08-28 DIAGNOSIS — I471 Supraventricular tachycardia: Secondary | ICD-10-CM | POA: Diagnosis not present

## 2021-10-16 DIAGNOSIS — R030 Elevated blood-pressure reading, without diagnosis of hypertension: Secondary | ICD-10-CM | POA: Diagnosis not present

## 2021-10-16 DIAGNOSIS — I471 Supraventricular tachycardia: Secondary | ICD-10-CM | POA: Diagnosis not present

## 2022-04-15 DIAGNOSIS — I471 Supraventricular tachycardia, unspecified: Secondary | ICD-10-CM | POA: Diagnosis not present

## 2022-04-15 DIAGNOSIS — R079 Chest pain, unspecified: Secondary | ICD-10-CM | POA: Diagnosis not present

## 2022-07-16 DIAGNOSIS — I471 Supraventricular tachycardia, unspecified: Secondary | ICD-10-CM | POA: Diagnosis not present

## 2022-09-23 IMAGING — CT CT HEAD W/O CM
3 of 4 series · 15 of 47 positions shown, 18 images · non-contrast
Comparison: None.

CLINICAL DATA: Abnormal mental status, loss of consciousness. Grip
weakness. Hit posterior head

EXAM:
CT HEAD WITHOUT CONTRAST
TECHNIQUE: Contiguous axial images were obtained from the base of the skull
through the vertex without intravenous contrast.

[Series 2: head wo · axial · 0.48mm/px · z∈[-122,+8]mm · 9 of 32 slices shown, 12 images]
[im 3/32  brain]
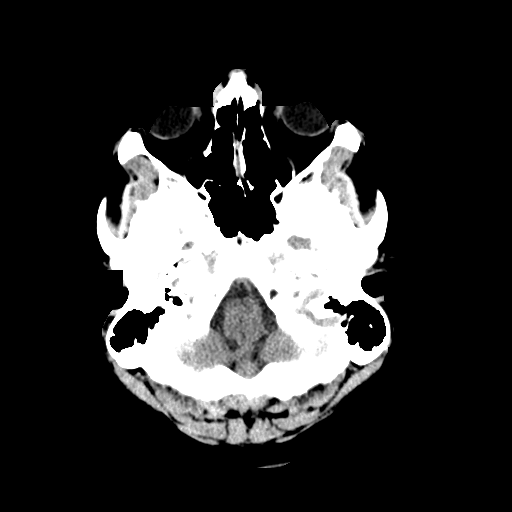
[im 3/32  bone]
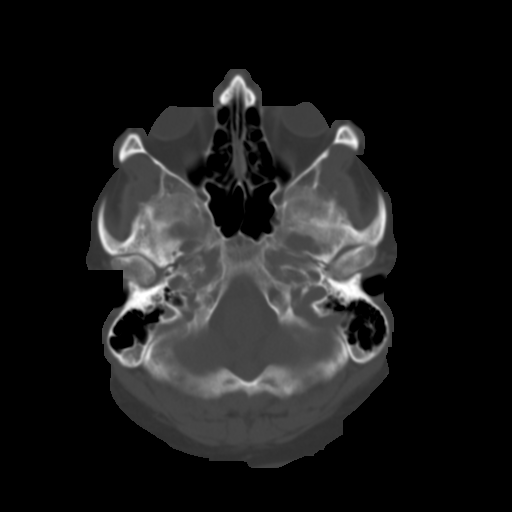
[im 7/32  brain]
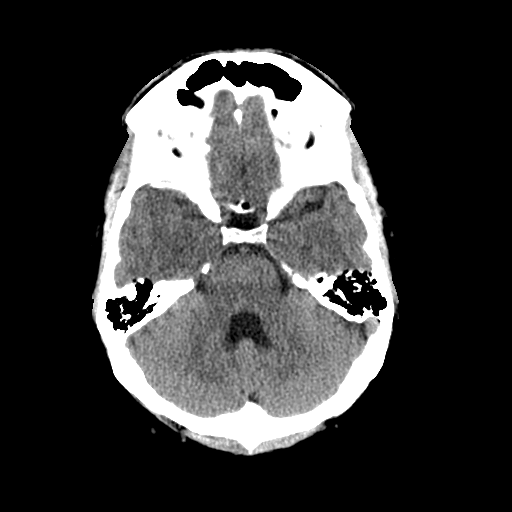
[im 9/32  brain]
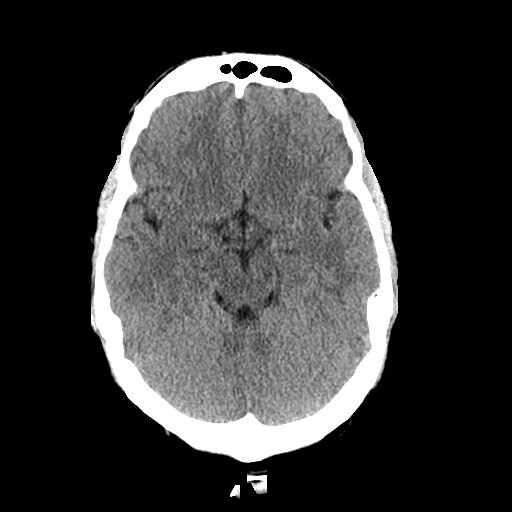
[im 14/32  brain]
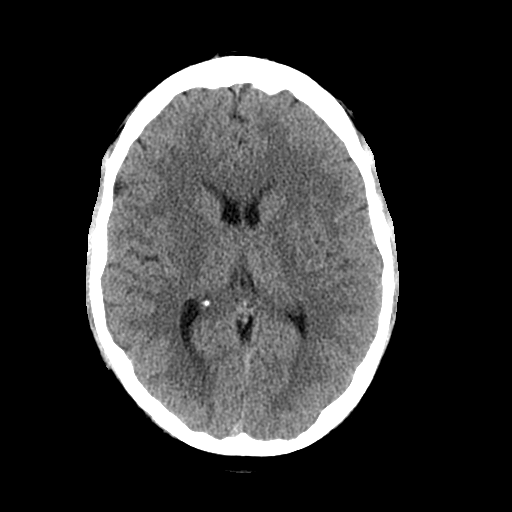
[im 16/32  brain]
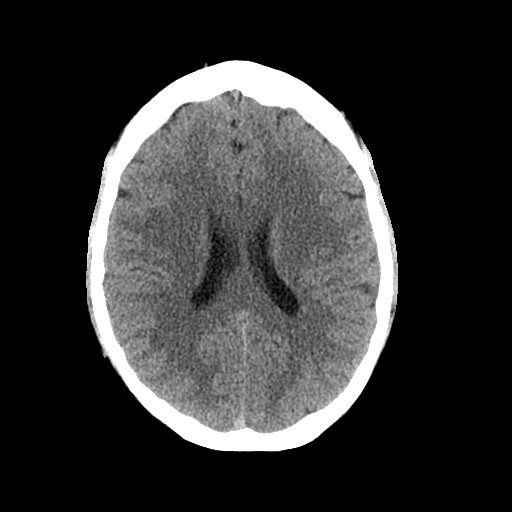
[im 16/32  bone]
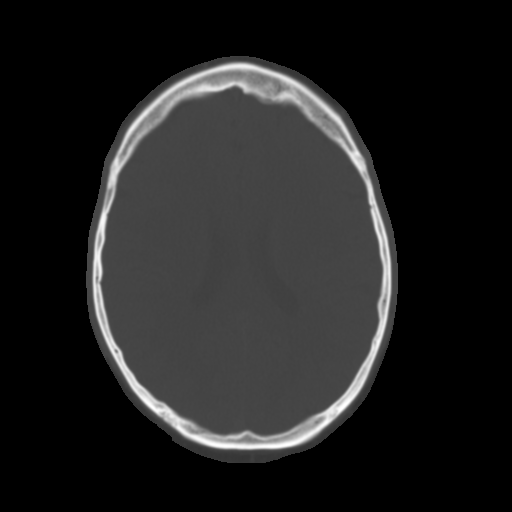
[im 18/32  brain]
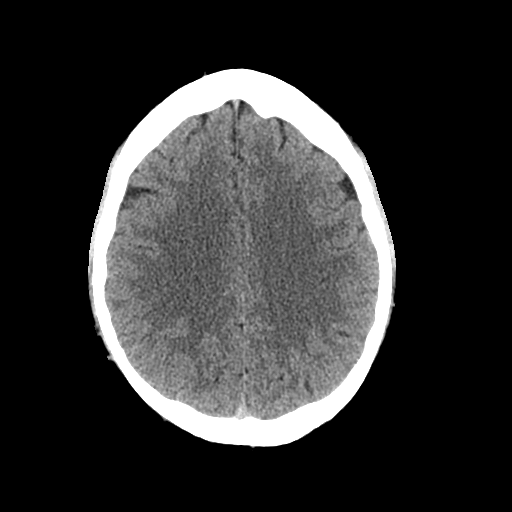
[im 23/32  brain]
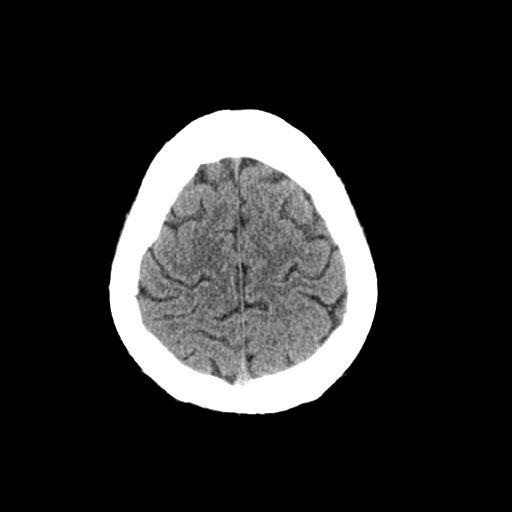
[im 25/32  brain]
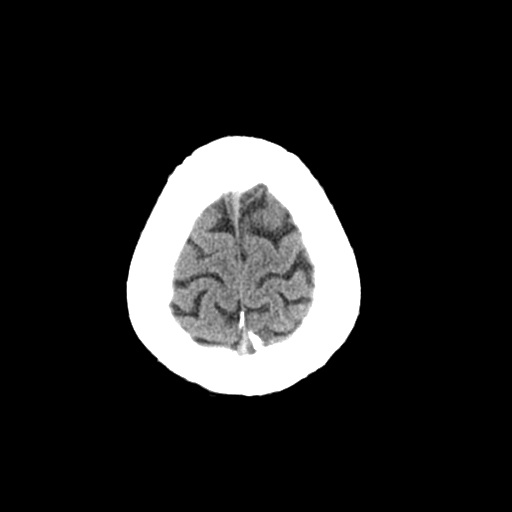
[im 29/32  brain]
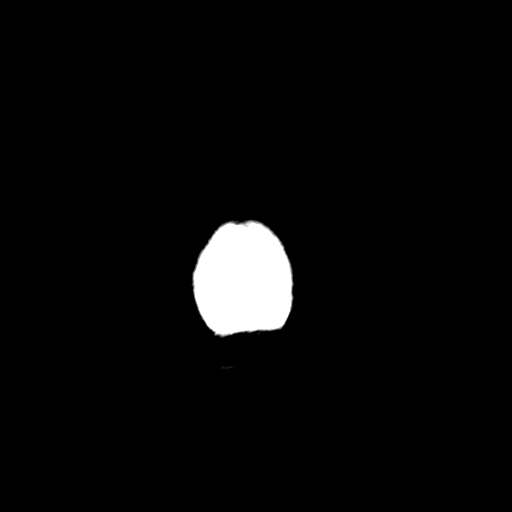
[im 29/32  bone]
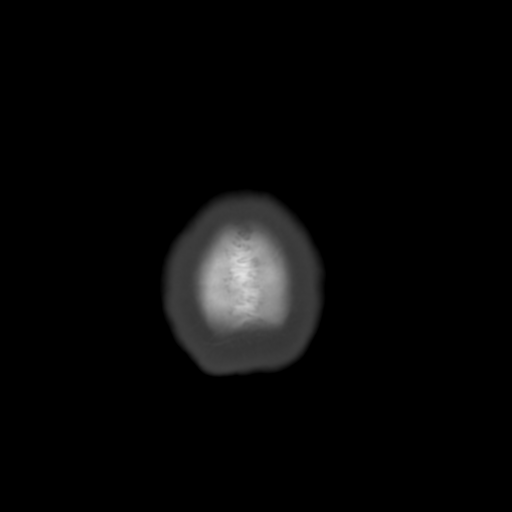

[Series 4: head wo coronal · coronal · 0.31mm/px · 3 of 74 slices shown]
[im 25/74  brain]
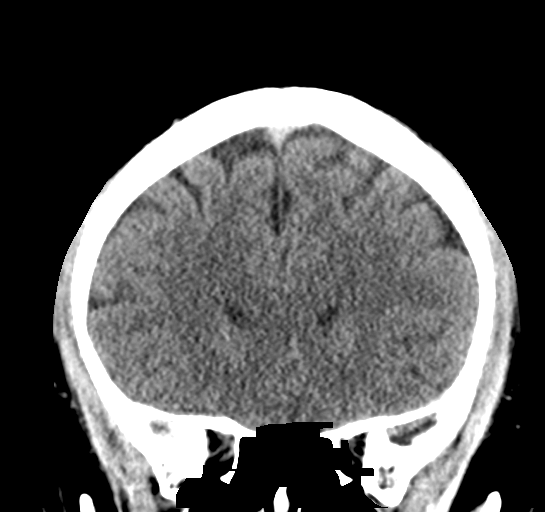
[im 33/74  brain]
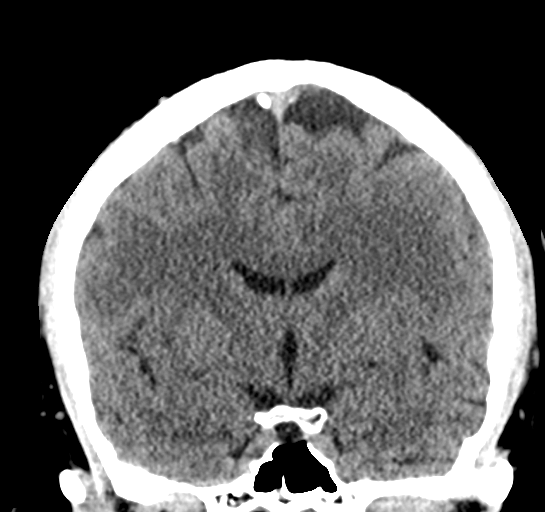
[im 41/74  brain]
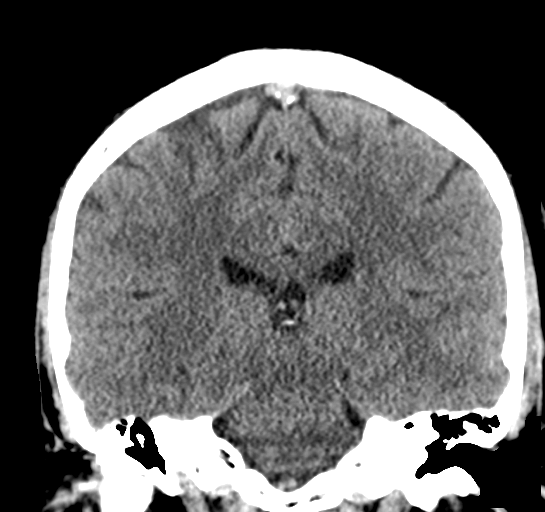

[Series 5: head wo sagittal · sagittal · 0.31mm/px · 3 of 60 slices shown]
[im 20/60  brain]
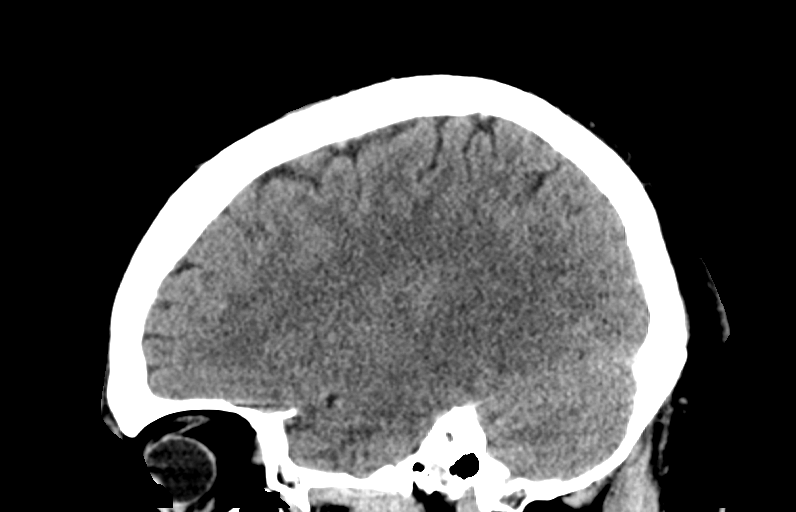
[im 30/60  brain]
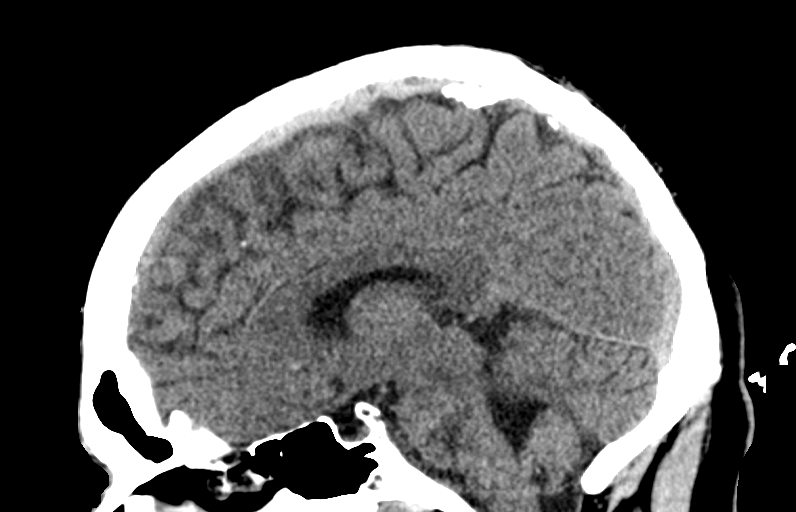
[im 40/60  brain]
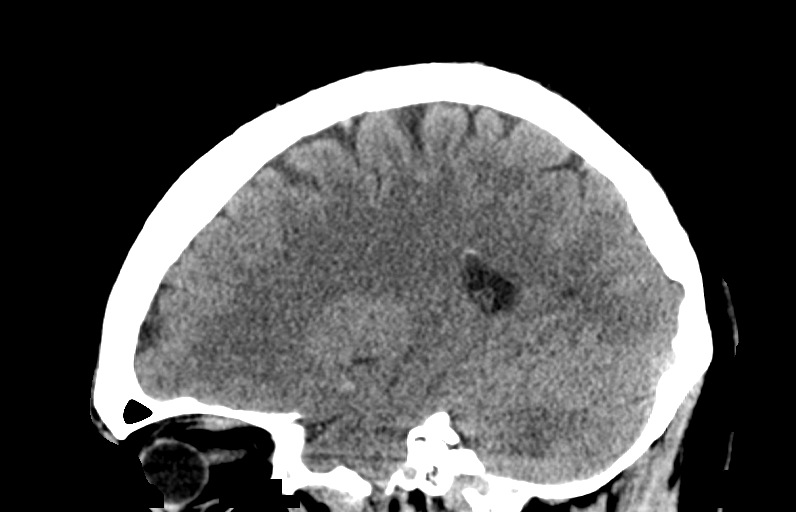

[15 of 47 positions shown; findings below may reference images not displayed]

FINDINGS: Brain:

No evidence of large-territorial acute infarction. No parenchymal
hemorrhage. No mass lesion. No extra-axial collection.

No mass effect or midline shift. No hydrocephalus. Basilar cisterns
are patent.

Vascular: No hyperdense vessel.

Skull: No acute fracture or focal lesion.

Sinuses/Orbits: Paranasal sinuses and mastoid air cells are clear.
The orbits are unremarkable.

Other: None.
IMPRESSION: No acute intracranial abnormality.

## 2023-01-21 DIAGNOSIS — Z1331 Encounter for screening for depression: Secondary | ICD-10-CM | POA: Diagnosis not present

## 2023-01-21 DIAGNOSIS — I471 Supraventricular tachycardia, unspecified: Secondary | ICD-10-CM | POA: Diagnosis not present

## 2023-06-20 ENCOUNTER — Telehealth: Payer: Self-pay | Admitting: Medical-Surgical

## 2023-06-20 NOTE — Telephone Encounter (Signed)
 Tried calling patient no answer and no voicemail I will try him again tomorrow to get him scheduled.

## 2023-06-20 NOTE — Telephone Encounter (Signed)
 Yes. He was my patient before but just missed the 3 year cutoff. I am happy to welcome him back.

## 2023-06-20 NOTE — Telephone Encounter (Signed)
 Copied from CRM [id number]. Topic: Appointments - Scheduling Inquiry for Clinic >> Jun 20, 2023 10:26 AM Arthur Payne wrote: Reason for CRM: Patient requesting to est with Arthur Payne, patient's spouse is Gershon Shorten and a current patient.

## 2023-07-07 ENCOUNTER — Other Ambulatory Visit: Payer: Self-pay

## 2023-07-07 ENCOUNTER — Other Ambulatory Visit: Payer: Self-pay | Admitting: Medical-Surgical

## 2023-07-14 ENCOUNTER — Encounter: Payer: Self-pay | Admitting: Medical-Surgical

## 2023-07-14 ENCOUNTER — Ambulatory Visit: Payer: Self-pay | Admitting: Medical-Surgical

## 2023-07-14 VITALS — BP 118/84 | HR 92 | Resp 20 | Ht 74.0 in | Wt 292.1 lb

## 2023-07-14 DIAGNOSIS — Z0000 Encounter for general adult medical examination without abnormal findings: Secondary | ICD-10-CM

## 2023-07-14 DIAGNOSIS — Z7689 Persons encountering health services in other specified circumstances: Secondary | ICD-10-CM | POA: Diagnosis not present

## 2023-07-14 DIAGNOSIS — R1011 Right upper quadrant pain: Secondary | ICD-10-CM | POA: Diagnosis not present

## 2023-07-14 NOTE — Progress Notes (Signed)
 New Patient Office Visit  Subjective:  Patient ID: Arthur Payne, male    DOB: 1986-01-31  Age: 38 y.o. MRN: [id number]  CC:  Chief Complaint  Patient presents with   Establish Care   HPI Arthur Payne presents to establish care. He is a very pleasant 38 year old male who was a previous patient of mine but missed the 3 year window to return. Because of this, he had to reestablish as a new patient.   Since he was last seen, he was diagnosed with supraventricular tachycardia and underwent a cardiac ablation.  Since the ablation, he has done well overall.  Has not had any recent symptoms of concern and has been following up with cardiology as instructed.  Reports that he has been having issues with heartburn for a couple of years now however over the last 6 months his symptoms have gotten much worse.  He has tried treating with over-the-counter Nexium however after several doses of that, he developed right upper quadrant pain after eating or when lifting/straining.  He discontinued the Nexium.  When symptoms returned, he decided to try OTC omeprazole however this produced the same results with the right upper quadrant pain.  He stopped this medication approximately 3 to 4 weeks ago and is now using Tums as needed when his symptoms flare.  At times, the right upper quadrant pain returns and can be as minimal as just a twinge or can be severe and debilitating, lasting 45 minutes or more.  Has not had this evaluated but is interested in further workup.  Past Medical History:  Diagnosis Date   BMI 34.0-34.9,adult 09/15/2018    Past Surgical History:  Procedure Laterality Date   APPENDECTOMY  1992   MOUTH SURGERY  11/2014    History reviewed. No pertinent family history.  Social History   Socioeconomic History   Marital status: Married    Spouse name: Not on file   Number of children: Not on file   Years of education: Not on file   Highest education level: Not on file  Occupational  History   Not on file  Tobacco Use   Smoking status: Never   Smokeless tobacco: Never  Substance and Sexual Activity   Alcohol use: Yes    Alcohol/week: 10.0 standard drinks of alcohol    Types: 10 Standard drinks or equivalent per week   Drug use: No   Sexual activity: Yes    Partners: Female  Other Topics Concern   Not on file  Social History Narrative   Not on file   Social Drivers of Health   Financial Resource Strain: Low Risk  (01/20/2023)   Received from [facility]   Overall Financial Resource Strain (CARDIA)    Difficulty of Paying Living Expenses: Not hard at all  Food Insecurity: No Food Insecurity (01/20/2023)   Received from [facility]   Hunger Vital Sign    Worried About Running Out of Food in the Last Year: Never true    Ran Out of Food in the Last Year: Never true  Transportation Needs: No Transportation Needs (01/20/2023)   Received from [facility] - Transportation    Lack of Transportation (Medical): No    Lack of Transportation (Non-Medical): No  Physical Activity: Insufficiently Active (01/20/2023)   Received from [facility]   Exercise Vital Sign    Days of Exercise per Week: 2 days    Minutes of Exercise per Session: 30 min  Stress: No Stress Concern  Present (01/20/2023)   Received from [facility] - Occupational Stress Questionnaire    Feeling of Stress : Only a little  Social Connections: Moderately Integrated (01/20/2023)   Received from [facility]   Social Network    How would you rate your social network (family, work, friends)?: Adequate participation with social networks  Intimate Partner Violence: Not At Risk (01/20/2023)   Received from [facility]   HITS    Over the last 12 months how often did your partner physically hurt you?: Never    Over the last 12 months how often did your partner insult you or talk down to you?: Rarely    Over the last 12 months how often  did your partner threaten you with physical harm?: Never    Over the last 12 months how often did your partner scream or curse at you?: Rarely    ROS Review of Systems  Constitutional:  Negative for chills and fever.  HENT:  Positive for postnasal drip and sinus pressure. Negative for sore throat.   Respiratory:  Negative for cough.   Gastrointestinal:  Positive for abdominal pain. Negative for abdominal distention, anal bleeding, blood in stool, constipation, diarrhea, nausea and vomiting.       Heartburn, RUQ pain  All other systems reviewed and are negative.  Objective:   Today's Vitals: BP 118/84 (BP Location: Left Arm, Cuff Size: Large)   Pulse 92   Resp 20   Ht 6\' 2"  (1.88 m)   Wt 292 lb 1.3 oz (132.5 kg)   SpO2 98%   BMI 37.50 kg/m   Physical Exam Vitals reviewed.  Constitutional:      General: He is not in acute distress.    Appearance: Normal appearance. He is not ill-appearing.  HENT:     Head: Normocephalic and atraumatic.     Right Ear: Tympanic membrane, ear canal and external ear normal. There is no impacted cerumen.     Left Ear: Tympanic membrane, ear canal and external ear normal. There is no impacted cerumen.     Nose: Nose normal. No congestion or rhinorrhea.     Mouth/Throat:     Mouth: Mucous membranes are moist.     Pharynx: No oropharyngeal exudate or posterior oropharyngeal erythema.  Eyes:     General: No scleral icterus.       Right eye: No discharge.        Left eye: No discharge.     Extraocular Movements: Extraocular movements intact.     Conjunctiva/sclera: Conjunctivae normal.     Pupils: Pupils are equal, round, and reactive to light.  Neck:     Thyroid: No thyromegaly.     Vascular: No carotid bruit or JVD.     Trachea: Trachea normal.  Cardiovascular:     Rate and Rhythm: Normal rate and regular rhythm.     Pulses: Normal pulses.     Heart sounds: Normal heart sounds. No murmur heard.    No friction rub. No gallop.  Pulmonary:      Effort: Pulmonary effort is normal. No respiratory distress.     Breath sounds: Normal breath sounds. No wheezing.  Abdominal:     General: Bowel sounds are normal. There is no distension.     Palpations: Abdomen is soft.     Tenderness: There is no abdominal tenderness. There is no guarding.  Musculoskeletal:        General: Normal range of motion.  Cervical back: Normal range of motion and neck supple.  Lymphadenopathy:     Cervical: No cervical adenopathy.  Skin:    General: Skin is warm and dry.  Neurological:     Mental Status: He is alert and oriented to person, place, and time.     Cranial Nerves: No cranial nerve deficit.  Psychiatric:        Mood and Affect: Mood normal.        Behavior: Behavior normal.        Thought Content: Thought content normal.        Judgment: Judgment normal.    Assessment & Plan:   1. Encounter to establish care (Primary) Reviewed available information and discussed care concerns with patient.   2. RUQ pain Unclear etiology.  Some concern for gallbladder contribution to right upper quadrant symptoms.  Exam unremarkable today.  Plan to check labs as below.  Pending ultrasound of the right upper quadrant abdomen.  Avoid greasy/fatty foods.  Okay to use Tums for now for heartburn symptoms.  A list of foods to avoid and dietary modifications are recommended in the setting of reflux was included via AVS. - US Abdomen Limited RUQ (LIVER/GB); Future - CMP14+EGFR - CBC with Differential/Platelet - Lipase - Amylase  3. Preventative health care Checking lipids today. - Lipid panel  Outpatient Encounter Medications as of 07/14/2023  Medication Sig   acetaminophen (TYLENOL) 500 MG tablet Take 500 mg by mouth every 6 (six) hours as needed.   BIOTIN PO Take by mouth.   fish oil-omega-3 fatty acids 1000 MG capsule Take by mouth.   [DISCONTINUED] COD LIVER OIL PO Take 1 capsule by mouth at bedtime.   No facility-administered encounter medications  on file as of 07/14/2023.    Follow-up: Return in about 1 year (around 07/13/2024) for annual physical exam or sooner if needed.   Arthur Ohm, DNP, APRN, FNP-BC [facility]

## 2023-07-15 ENCOUNTER — Encounter: Payer: Self-pay | Admitting: Medical-Surgical

## 2023-07-15 LAB — CBC WITH DIFFERENTIAL/PLATELET
Basophils Absolute: 0.1 10*3/uL (ref 0.0–0.2)
Basos: 1 %
EOS (ABSOLUTE): 0.2 10*3/uL (ref 0.0–0.4)
Eos: 3 %
Hematocrit: 49.6 % (ref 37.5–51.0)
Hemoglobin: 16.8 g/dL (ref 13.0–17.7)
Immature Grans (Abs): 0 10*3/uL (ref 0.0–0.1)
Immature Granulocytes: 0 %
Lymphocytes Absolute: 2.3 10*3/uL (ref 0.7–3.1)
Lymphs: 31 %
MCH: 29.3 pg (ref 26.6–33.0)
MCHC: 33.9 g/dL (ref 31.5–35.7)
MCV: 87 fL (ref 79–97)
Monocytes Absolute: 0.9 10*3/uL (ref 0.1–0.9)
Monocytes: 12 %
Neutrophils Absolute: 4 10*3/uL (ref 1.4–7.0)
Neutrophils: 53 %
Platelets: 291 10*3/uL (ref 150–450)
RBC: 5.73 x10E6/uL (ref 4.14–5.80)
RDW: 12.9 % (ref 11.6–15.4)
WBC: 7.5 10*3/uL (ref 3.4–10.8)

## 2023-07-15 LAB — CMP14+EGFR
ALT: 43 IU/L (ref 0–44)
AST: 23 IU/L (ref 0–40)
Albumin: 4.9 g/dL (ref 4.1–5.1)
Alkaline Phosphatase: 81 IU/L (ref 44–121)
BUN/Creatinine Ratio: 9 (ref 9–20)
BUN: 9 mg/dL (ref 6–20)
Bilirubin Total: 0.6 mg/dL (ref 0.0–1.2)
CO2: 22 mmol/L (ref 20–29)
Calcium: 10 mg/dL (ref 8.7–10.2)
Chloride: 100 mmol/L (ref 96–106)
Creatinine, Ser: 1.03 mg/dL (ref 0.76–1.27)
Globulin, Total: 2.4 g/dL (ref 1.5–4.5)
Glucose: 93 mg/dL (ref 70–99)
Potassium: 4.4 mmol/L (ref 3.5–5.2)
Sodium: 139 mmol/L (ref 134–144)
Total Protein: 7.3 g/dL (ref 6.0–8.5)
eGFR: 95 mL/min/{1.73_m2} (ref >59)

## 2023-07-15 LAB — LIPID PANEL
Chol/HDL Ratio: 6.2 ratio — ABNORMAL HIGH (ref 0.0–5.0)
Cholesterol, Total: 229 mg/dL — ABNORMAL HIGH (ref 100–199)
HDL: 37 mg/dL — ABNORMAL LOW (ref >39)
LDL Chol Calc (NIH): 122 mg/dL — ABNORMAL HIGH (ref 0–99)
Triglycerides: 394 mg/dL — ABNORMAL HIGH (ref 0–149)
VLDL Cholesterol Cal: 70 mg/dL — ABNORMAL HIGH (ref 5–40)

## 2023-07-15 LAB — AMYLASE: Amylase: 32 U/L (ref 31–110)

## 2023-07-15 LAB — LIPASE: Lipase: 19 U/L (ref 13–78)

## 2023-07-26 ENCOUNTER — Encounter: Payer: Self-pay | Admitting: Medical-Surgical

## 2023-07-26 ENCOUNTER — Ambulatory Visit (INDEPENDENT_AMBULATORY_CARE_PROVIDER_SITE_OTHER)

## 2023-07-26 DIAGNOSIS — K8020 Calculus of gallbladder without cholecystitis without obstruction: Secondary | ICD-10-CM | POA: Diagnosis not present

## 2023-07-26 DIAGNOSIS — R1011 Right upper quadrant pain: Secondary | ICD-10-CM

## 2023-07-26 DIAGNOSIS — K760 Fatty (change of) liver, not elsewhere classified: Secondary | ICD-10-CM | POA: Diagnosis not present

## 2023-11-21 ENCOUNTER — Encounter: Payer: Self-pay | Admitting: Medical-Surgical

## 2023-11-22 NOTE — Telephone Encounter (Signed)
 Messaged patient and placed up front for pick up

## 2024-07-16 ENCOUNTER — Encounter: Admitting: Medical-Surgical
